# Patient Record
Sex: Female | Born: 1975
Health system: Southern US, Community
[De-identification: ages and names within clinical notes are randomized; demographics above are authoritative.]

## PROBLEM LIST (undated history)

## (undated) DIAGNOSIS — E119 Type 2 diabetes mellitus without complications: Secondary | ICD-10-CM

## (undated) DIAGNOSIS — G629 Polyneuropathy, unspecified: Secondary | ICD-10-CM

## (undated) HISTORY — DX: Type 2 diabetes mellitus without complications: E11.9

## (undated) HISTORY — DX: Polyneuropathy, unspecified: G62.9

## (undated) HISTORY — PX: CHOLECYSTECTOMY: SHX55

## (undated) HISTORY — PX: TUBAL LIGATION: SHX77

---

## 2006-08-15 ENCOUNTER — Ambulatory Visit (HOSPITAL_COMMUNITY): Admission: RE | Admit: 2006-08-15 | Discharge: 2006-08-15 | Payer: Self-pay | Admitting: Gastroenterology

## 2006-09-18 ENCOUNTER — Encounter: Admission: RE | Admit: 2006-09-18 | Discharge: 2006-11-15 | Payer: Self-pay | Admitting: Orthopedic Surgery

## 2009-02-28 ENCOUNTER — Inpatient Hospital Stay (HOSPITAL_COMMUNITY): Admission: EM | Admit: 2009-02-28 | Discharge: 2009-03-03 | Payer: Self-pay | Admitting: Emergency Medicine

## 2009-03-01 ENCOUNTER — Encounter (INDEPENDENT_AMBULATORY_CARE_PROVIDER_SITE_OTHER): Payer: Self-pay | Admitting: General Surgery

## 2009-12-25 ENCOUNTER — Emergency Department (HOSPITAL_COMMUNITY)
Admission: EM | Admit: 2009-12-25 | Discharge: 2009-12-25 | Payer: Self-pay | Source: Home / Self Care | Admitting: Emergency Medicine

## 2010-07-21 LAB — URINE MICROSCOPIC-ADD ON

## 2010-07-21 LAB — CBC
HCT: 40.3 % (ref 36.0–46.0)
Hemoglobin: 13.1 g/dL (ref 12.0–15.0)
MCH: 24.2 pg — ABNORMAL LOW (ref 26.0–34.0)
MCHC: 32.5 g/dL (ref 30.0–36.0)
MCV: 74.4 fL — ABNORMAL LOW (ref 78.0–100.0)
Platelets: 262 10*3/uL (ref 150–400)
RDW: 15.2 % (ref 11.5–15.5)
WBC: 8.6 10*3/uL (ref 4.0–10.5)

## 2010-07-21 LAB — BASIC METABOLIC PANEL
BUN: 9 mg/dL (ref 6–23)
CO2: 22 mEq/L (ref 19–32)
Calcium: 8.6 mg/dL (ref 8.4–10.5)
Chloride: 101 mEq/L (ref 96–112)
GFR calc non Af Amer: >60 mL/min (ref >60)
Sodium: 130 mEq/L — ABNORMAL LOW (ref 135–145)

## 2010-07-21 LAB — URINE CULTURE: Colony Count: >=100000

## 2010-07-21 LAB — DIFFERENTIAL
Lymphocytes Relative: 26 % (ref 12–46)
Neutro Abs: 5.8 10*3/uL (ref 1.7–7.7)

## 2010-07-21 LAB — URINALYSIS, ROUTINE W REFLEX MICROSCOPIC: Protein, ur: 30 mg/dL — AB

## 2010-07-21 LAB — GC/CHLAMYDIA PROBE AMP, GENITAL: Chlamydia, DNA Probe: NEGATIVE

## 2010-07-21 LAB — WET PREP, GENITAL
Trich, Wet Prep: NONE SEEN
Yeast Wet Prep HPF POC: NONE SEEN

## 2010-07-21 LAB — POCT CARDIAC MARKERS: Myoglobin, poc: 59.7 ng/mL (ref 12–200)

## 2010-08-10 LAB — COMPREHENSIVE METABOLIC PANEL WITH GFR
AST: 283 U/L — ABNORMAL HIGH (ref 0–37)
Albumin: 2.8 g/dL — ABNORMAL LOW (ref 3.5–5.2)
Calcium: 8.1 mg/dL — ABNORMAL LOW (ref 8.4–10.5)
GFR calc Af Amer: >60 mL/min (ref >60)
Sodium: 133 meq/L — ABNORMAL LOW (ref 135–145)

## 2010-08-10 LAB — GLUCOSE, CAPILLARY
Glucose-Capillary: 118 mg/dL — ABNORMAL HIGH (ref 70–99)
Glucose-Capillary: 130 mg/dL — ABNORMAL HIGH (ref 70–99)
Glucose-Capillary: 135 mg/dL — ABNORMAL HIGH (ref 70–99)
Glucose-Capillary: 135 mg/dL — ABNORMAL HIGH (ref 70–99)
Glucose-Capillary: 150 mg/dL — ABNORMAL HIGH (ref 70–99)
Glucose-Capillary: 158 mg/dL — ABNORMAL HIGH (ref 70–99)
Glucose-Capillary: 161 mg/dL — ABNORMAL HIGH (ref 70–99)
Glucose-Capillary: 167 mg/dL — ABNORMAL HIGH (ref 70–99)
Glucose-Capillary: 167 mg/dL — ABNORMAL HIGH (ref 70–99)
Glucose-Capillary: 181 mg/dL — ABNORMAL HIGH (ref 70–99)
Glucose-Capillary: 203 mg/dL — ABNORMAL HIGH (ref 70–99)
Glucose-Capillary: 203 mg/dL — ABNORMAL HIGH (ref 70–99)
Glucose-Capillary: 218 mg/dL — ABNORMAL HIGH (ref 70–99)
Glucose-Capillary: 232 mg/dL — ABNORMAL HIGH (ref 70–99)
Glucose-Capillary: 271 mg/dL — ABNORMAL HIGH (ref 70–99)

## 2010-08-10 LAB — COMPREHENSIVE METABOLIC PANEL
ALT: 130 U/L — ABNORMAL HIGH (ref 0–35)
ALT: 198 U/L — ABNORMAL HIGH (ref 0–35)
AST: 249 U/L — ABNORMAL HIGH (ref 0–37)
Albumin: 2.6 g/dL — ABNORMAL LOW (ref 3.5–5.2)
Alkaline Phosphatase: 150 U/L — ABNORMAL HIGH (ref 39–117)
Alkaline Phosphatase: 153 U/L — ABNORMAL HIGH (ref 39–117)
BUN: 6 mg/dL (ref 6–23)
CO2: 25 mEq/L (ref 19–32)
Chloride: 102 mEq/L (ref 96–112)
Creatinine, Ser: 0.85 mg/dL (ref 0.4–1.2)
GFR calc Af Amer: >60 mL/min (ref >60)
GFR calc non Af Amer: >60 mL/min (ref >60)
Glucose, Bld: 159 mg/dL — ABNORMAL HIGH (ref 70–99)
Glucose, Bld: 345 mg/dL — ABNORMAL HIGH (ref 70–99)
Potassium: 3.7 mEq/L (ref 3.5–5.1)
Potassium: 4.2 mEq/L (ref 3.5–5.1)
Sodium: 139 mEq/L (ref 135–145)
Total Bilirubin: 0.4 mg/dL (ref 0.3–1.2)
Total Protein: 6.1 g/dL (ref 6.0–8.3)
Total Protein: 6.3 g/dL (ref 6.0–8.3)

## 2010-08-10 LAB — URINE MICROSCOPIC-ADD ON

## 2010-08-10 LAB — CBC
HCT: 31.9 % — ABNORMAL LOW (ref 36.0–46.0)
Hemoglobin: 10.5 g/dL — ABNORMAL LOW (ref 12.0–15.0)
MCHC: 33.1 g/dL (ref 30.0–36.0)
MCV: 76.8 fL — ABNORMAL LOW (ref 78.0–100.0)
Platelets: 210 10*3/uL (ref 150–400)
Platelets: 225 10*3/uL (ref 150–400)
RBC: 4.16 MIL/uL (ref 3.87–5.11)
RDW: 14.6 % (ref 11.5–15.5)
RDW: 14.9 % (ref 11.5–15.5)
WBC: 6.9 10*3/uL (ref 4.0–10.5)
WBC: 7.3 10*3/uL (ref 4.0–10.5)

## 2010-08-10 LAB — DIFFERENTIAL
Basophils Absolute: 0 K/uL (ref 0.0–0.1)
Basophils Relative: 0 % (ref 0–1)
Eosinophils Absolute: 0.1 10*3/uL (ref 0.0–0.7)
Eosinophils Relative: 1 % (ref 0–5)
Lymphocytes Relative: 11 % — ABNORMAL LOW (ref 12–46)
Lymphs Abs: 0.8 K/uL (ref 0.7–4.0)
Monocytes Absolute: 0.4 10*3/uL (ref 0.1–1.0)
Monocytes Relative: 5 % (ref 3–12)
Neutro Abs: 6 10*3/uL (ref 1.7–7.7)
Neutrophils Relative %: 82 % — ABNORMAL HIGH (ref 43–77)

## 2010-08-10 LAB — URINALYSIS, ROUTINE W REFLEX MICROSCOPIC
Bilirubin Urine: NEGATIVE
Glucose, UA: >1000 mg/dL — AB
Hgb urine dipstick: NEGATIVE
Ketones, ur: NEGATIVE mg/dL
Leukocytes, UA: NEGATIVE
Nitrite: NEGATIVE
Protein, ur: NEGATIVE mg/dL
Specific Gravity, Urine: 1.026 (ref 1.005–1.030)
Urobilinogen, UA: 1 mg/dL (ref 0.0–1.0)
pH: 6.5 (ref 5.0–8.0)

## 2010-08-10 LAB — PREGNANCY, URINE: Preg Test, Ur: NEGATIVE

## 2010-08-10 LAB — HEMOGLOBIN A1C: Hgb A1c MFr Bld: 9.5 % — ABNORMAL HIGH (ref 4.6–6.1)

## 2010-08-10 LAB — LIPASE, BLOOD: Lipase: 14 U/L (ref 11–59)

## 2010-09-22 NOTE — Op Note (Signed)
Leslie Moore, Leslie Moore            ACCOUNT NO.:  [id number]   MEDICAL RECORD NO.:  [id number]          PATIENT TYPE:  AMB   LOCATION:  ENDO                         FACILITY:  [facility]   PHYSICIAN:  Shirley Friar, MDDATE OF BIRTH:  1976-03-18   DATE OF PROCEDURE:  08/15/2006  DATE OF DISCHARGE:                               OPERATIVE REPORT   PROCEDURE:  Upper endoscopy.   INDICATIONS:  Abdominal pain, chronic NSAID use.   MEDICATIONS:  Fentanyl 75 mcg IV, Versed 10 mg IV.   FINDINGS:  Endoscope was inserted to the oropharynx.  Esophagus was  intubated which was normal in its entirety.  Endoscope was advanced  down to the stomach which revealed a small antral erosion but otherwise  was normal.  Retroflexion was done which revealed normal proximal  stomach.  Endoscope was straightened and advanced down to the duodenal  bulb and second portion of duodenum which were both normal.  Endoscope  was withdrawn back into the stomach and one biopsy was taken in the  antrum to send for CLO testing.  The endoscope was then withdrawn to  confirmed above findings.   ASSESSMENT:  1. Small antral erosion;  otherwise normal EGD.  2. Status post biopsy for CLO-test.  3. No ulcer seen.      Shirley Friar, MD  Electronically Signed     VCS/MEDQ  D:  08/15/2006  T:  08/16/2006  Job:  [id number]   cc:   Shelbie Proctor. Shawnie Pons, M.D.

## 2016-11-29 ENCOUNTER — Other Ambulatory Visit: Payer: Self-pay | Admitting: Internal Medicine

## 2016-11-29 DIAGNOSIS — Z1231 Encounter for screening mammogram for malignant neoplasm of breast: Secondary | ICD-10-CM

## 2016-12-10 ENCOUNTER — Ambulatory Visit
Admission: RE | Admit: 2016-12-10 | Discharge: 2016-12-10 | Disposition: A | Discharge status: Home / Self Care | Payer: Self-pay | Source: Ambulatory Visit | Attending: Internal Medicine | Admitting: Internal Medicine

## 2016-12-10 DIAGNOSIS — Z1231 Encounter for screening mammogram for malignant neoplasm of breast: Secondary | ICD-10-CM

## 2016-12-25 ENCOUNTER — Other Ambulatory Visit: Payer: Self-pay | Admitting: Obstetrics and Gynecology

## 2016-12-25 ENCOUNTER — Other Ambulatory Visit (HOSPITAL_COMMUNITY)
Admission: RE | Admit: 2016-12-25 | Discharge: 2016-12-25 | Disposition: A | Discharge status: Home / Self Care | Payer: BLUE CROSS/BLUE SHIELD | Source: Ambulatory Visit | Attending: Obstetrics and Gynecology | Admitting: Obstetrics and Gynecology

## 2016-12-25 DIAGNOSIS — Z124 Encounter for screening for malignant neoplasm of cervix: Secondary | ICD-10-CM | POA: Diagnosis present

## 2016-12-31 LAB — CYTOLOGY - PAP
Diagnosis: NEGATIVE
HPV: NOT DETECTED

## 2017-02-26 ENCOUNTER — Other Ambulatory Visit: Payer: Self-pay | Admitting: Obstetrics and Gynecology

## 2017-03-14 ENCOUNTER — Encounter: Payer: Self-pay | Admitting: Neurology

## 2017-06-14 ENCOUNTER — Other Ambulatory Visit (INDEPENDENT_AMBULATORY_CARE_PROVIDER_SITE_OTHER): Payer: BLUE CROSS/BLUE SHIELD

## 2017-06-14 ENCOUNTER — Encounter: Payer: Self-pay | Admitting: Neurology

## 2017-06-14 ENCOUNTER — Ambulatory Visit: Payer: BLUE CROSS/BLUE SHIELD | Admitting: Neurology

## 2017-06-14 VITALS — BP 100/68 | HR 61 | Ht 64.0 in | Wt 158.5 lb

## 2017-06-14 DIAGNOSIS — E1140 Type 2 diabetes mellitus with diabetic neuropathy, unspecified: Secondary | ICD-10-CM

## 2017-06-14 LAB — C-REACTIVE PROTEIN: CRP: 0.2 mg/dL — ABNORMAL LOW (ref 0.5–20.0)

## 2017-06-14 LAB — FOLATE: Folate: >23.6 ng/mL (ref >5.9)

## 2017-06-14 LAB — SEDIMENTATION RATE: Sed Rate: 105 mm/hr — ABNORMAL HIGH (ref 0–20)

## 2017-06-14 LAB — VITAMIN B12: Vitamin B-12: 555 pg/mL (ref 211–911)

## 2017-06-14 MED ORDER — GABAPENTIN 300 MG PO CAPS: ORAL_CAPSULE | ORAL | 5 refills | Status: DC

## 2017-06-14 NOTE — Patient Instructions (Addendum)
Start gabapentin 300mg  at bedtime x 3 days, then increase to 1 tablet twice daily.   Start lidocaine ointment to feet twice daily as needed (Aspercream, Salonpas).  Avoid creams with menthol  Check labs   Call my office with an update in 2 weeks and we can decide how to increase the medication  Return to clinic in on April 4th at 3:30pm

## 2017-06-14 NOTE — Addendum Note (Signed)
Addended by: Vivien RotaRIVER, Daly Whipkey H on: 1/6/10962/12/2017 02:31 PM   Modules accepted: Orders

## 2017-06-14 NOTE — Progress Notes (Signed)
[facility] Clinic Note - Initial Visit   Date: 06/14/17  KINAYA HILLIKER MRN: [id number] DOB: [birth year]   Dear Dr. Fara Olden:  Thank you for your kind referral of TALYSSA GIBAS for consultation of diabetic neuropathy. Although her history is well known to you, please allow [place] to reiterate it for the purpose of our medical record. The patient was accompanied to the clinic by granddaughter, Acie Fredrickson.   History of Present Illness: Leslie Moore is a 42 y.o. right-handed African American female with diabetes mellitus complicated by retinopathy presenting for evaluation of neuropathy.    She was diagnosed with diabetes 2010 and starting in 2018, she started having burning, shooting, and tingling sensation of the hands and feet. Pain does not radiate in her lower legs or forearms.  She has sensitivity to touch and tight shoes.  Walking, especially barefoot makes her pain significantly worse.  Symptoms were intermittent and now occur nightly.  She has difficulty with opening jars/bottles and has had a few falls because of severe pain.  She was offered gabapentin 153m and did not appreciate benefit so stopped it.  She is not working currently.    Out-side paper records, electronic medical record, and images have been reviewed where available and summarized as:  Lab 03/12/2017:  HbA1c 7.3, Cr 0.66, GFR 118, LFTs normal  Past Medical History:  Diagnosis Date  . [place]   . Neuropathy     Past Surgical History:  Procedure Laterality Date  . CHOLECYSTECTOMY    . TUBAL LIGATION       Medications:  Outpatient Encounter Medications as of 06/14/2017  Medication Sig  . EQ ASPIRIN ADULT LOW DOSE 81 MG EC tablet   . GLIPIZIDE XL 5 MG 24 hr tablet   . metFORMIN (GLUCOPHAGE) 500 MG tablet Take 500 mg by mouth 2 (two) times daily with a meal.  . Semaglutide (OZEMPIC) 0.25 or 0.5 MG/DOSE SOPN Inject into the skin.  .Marland Kitchengabapentin (NEURONTIN) 300 MG  capsule Take 1 tablet daily x 3 days then increase to 1 tablet twice daily.  Call with update in 2 weeks.   No facility-administered encounter medications on file as of 06/14/2017.      Allergies: No Known Allergies  Family History: Family History  Problem Relation Age of Onset  . Paranoid behavior Mother     Social History: Social History   Tobacco Use  . Smoking status: Never Smoker  . Smokeless tobacco: Never Used  Substance Use Topics  . Alcohol use: No    Frequency: Never  . Drug use: No   Social History   Social History Narrative   Lives with husband in a one story home.  Has 4 children.  Currently not working.  Was working as a CTechnical brewer  Education: high school.     Review of Systems:  CONSTITUTIONAL: No fevers, chills, night sweats, or weight loss.   EYES: No visual changes or eye pain ENT: No hearing changes.  No history of nose bleeds.   RESPIRATORY: No cough, wheezing and shortness of breath.   CARDIOVASCULAR: Negative for chest pain, and palpitations.   GI: Negative for abdominal discomfort, blood in stools or black stools.  No recent change in bowel habits.   GU:  No history of incontinence.   MUSCLOSKELETAL: No history of joint pain or swelling.  +myalgias.   SKIN: Negative for lesions, rash, and itching.   HEMATOLOGY/ONCOLOGY: Negative for prolonged bleeding, bruising easily, and swollen nodes.  No  history of cancer.   ENDOCRINE: Negative for cold or heat intolerance, polydipsia or goiter.   PSYCH:  No depression or anxiety symptoms.   NEURO: As Above.   Vital Signs:  BP 100/68   Pulse 61   Ht '5\' 4"'  (1.626 m)   Wt 158 lb 8 oz (71.9 kg)   SpO2 100%   BMI 27.21 kg/m    General Medical Exam:   General:  Well appearing, comfortable.   Eyes/ENT: see cranial nerve examination.   Neck: No masses appreciated.  Full range of motion without tenderness.  No carotid bruits. Respiratory:  Clear to auscultation, good air entry bilaterally.   Cardiac:  Regular  rate and rhythm, no murmur.   Extremities:  No deformities, edema, or skin discoloration.  Skin:  No rashes or lesions.  Neurological Exam: MENTAL STATUS including orientation to time, place, person, recent and remote memory, attention span and concentration, language, and fund of knowledge is normal.  Speech is not dysarthric.  CRANIAL NERVES: II:  No visual field defects.  Increased vascularity in the posterior segment, disc are flat..   III-IV-VI: Pupils equal round and reactive to light.  Normal conjugate, extra-ocular eye movements in all directions of gaze.  No nystagmus.  No ptosis.   V:  Normal facial sensation.   VII:  Normal facial symmetry and movements.   VIII:  Normal hearing and vestibular function.   IX-X:  Normal palatal movement.   XI:  Normal shoulder shrug and head rotation.   XII:  Normal tongue strength and range of motion, no deviation or fasciculation.  MOTOR:  No atrophy, fasciculations or abnormal movements.  No pronator drift.  Tone is normal.    Right Upper Extremity:    Left Upper Extremity:    Deltoid  5/5   Deltoid  5/5   Biceps  5/5   Biceps  5/5   Triceps  5/5   Triceps  5/5   Wrist extensors  5/5   Wrist extensors  5/5   Wrist flexors  5/5   Wrist flexors  5/5   Finger extensors  5/5   Finger extensors  5/5   Finger flexors  5/5   Finger flexors  5/5   Dorsal interossei  4+/5   Dorsal interossei  4+/5   Abductor pollicis  4+/5   Abductor pollicis  4+/5   Tone (Ashworth scale)  0  Tone (Ashworth scale)  0   Right Lower Extremity:    Left Lower Extremity:    Hip flexors  5/5   Hip flexors  5/5   Hip extensors  5/5   Hip extensors  5/5   Knee flexors  5/5   Knee flexors  5/5   Knee extensors  5/5   Knee extensors  5/5   Dorsiflexors  5/5   Dorsiflexors  5/5   Plantarflexors  5/5   Plantarflexors  5/5   Toe extensors  4+/5   Toe extensors  4+/5   Toe flexors  4+/5   Toe flexors  4+/5   Tone (Ashworth scale)  0  Tone (Ashworth scale)  0    MSRs:  Right  Left brachioradialis 2+  brachioradialis 2+  biceps 2+  biceps 2+  triceps 2+  triceps 2+  patellar 2+  patellar 2+  ankle jerk 0  ankle jerk 0  Hoffman no  Hoffman no  plantar response down  plantar response down   SENSORY:  Vibration is 100% at the elbows, reduced to 50% at the hands and knees, absent at the ankles.  Temperature and pin prick is reduced over the fingertips and distal to ankles bilaterally.  Romberg's sign shows mild sway.   COORDINATION/GAIT: Normal finger-to- nose-finger.  Intact rapid alternating movements bilaterally.  Gait appears antalgic due to feet pain and she becomes tearful when walking barefoot.  She is able to walk more comfortably wearing socks.  Unable to stand on heels or toes.    IMPRESSION: Distal and symmetric painful diabetic neuropathy affecting a stocking-glove distribution.    - Patient educated on the diagnosis, pathogenesis, and management options  - Start gabapentin 378m at bedtime x 3 days, then increase to 3013mBID.  Patient to call with update in 2 weeks and if tolerating, titrate higher  - Start OTC lidocaine ointment to feet  - Check vitamin B12, folate, copper, ESR, and CRP  - Encouraged her to work closely with her PCP to keep blood sugars under control  - Fall precautions discussed  Return to clinic in 2 months.  Thank you for allowing me to participate in patient's care.  If I can answer any additional questions, I would be pleased to do so.    Sincerely,    Lupe Handley K. PaPosey ProntoDO

## 2017-06-17 LAB — ANTI-NUCLEAR AB-TITER (ANA TITER)

## 2017-06-17 LAB — SJOGREN'S SYNDROME ANTIBODS(SSA + SSB)
SSA (Ro) (ENA) Antibody, IgG: <1 AI
SSB (LA) (ENA) ANTIBODY, IGG: NEGATIVE AI

## 2017-06-17 LAB — ANA: ANA: POSITIVE — AB

## 2017-06-18 LAB — COPPER, SERUM: COPPER: 144 ug/dL (ref 70–175)

## 2017-06-19 ENCOUNTER — Telehealth: Payer: Self-pay | Admitting: *Deleted

## 2017-06-19 ENCOUNTER — Other Ambulatory Visit: Payer: Self-pay | Admitting: *Deleted

## 2017-06-19 DIAGNOSIS — R899 Unspecified abnormal finding in specimens from other organs, systems and tissues: Secondary | ICD-10-CM

## 2017-06-19 NOTE — Telephone Encounter (Signed)
Left message for patient to call me back. 

## 2017-06-19 NOTE — Telephone Encounter (Signed)
-----  Message from Leslie Berthold, DO sent at 06/19/2017  9:35 AM EST ----- Please inform patient that her inflammatory marker is very high and I want to check a few more autoimmune labs - check RF, cryoglobulins, c-ANCA and p-ANCA, ACE, ESR.  The remaining labs looked good.   Also ask to see how she is tolerating gabapentin.  Thanks.

## 2017-06-19 NOTE — Telephone Encounter (Signed)
Patient given results and instructions.  She will come in on Friday to have labs done.  Doing well on gabapentin.

## 2017-06-20 ENCOUNTER — Other Ambulatory Visit: Payer: Self-pay | Admitting: *Deleted

## 2017-06-20 DIAGNOSIS — R899 Unspecified abnormal finding in specimens from other organs, systems and tissues: Secondary | ICD-10-CM

## 2017-06-21 ENCOUNTER — Other Ambulatory Visit (INDEPENDENT_AMBULATORY_CARE_PROVIDER_SITE_OTHER): Payer: BLUE CROSS/BLUE SHIELD

## 2017-06-21 DIAGNOSIS — R899 Unspecified abnormal finding in specimens from other organs, systems and tissues: Secondary | ICD-10-CM

## 2017-06-21 LAB — SEDIMENTATION RATE: SED RATE: 91 mm/h — AB (ref 0–20)

## 2017-06-24 LAB — RHEUMATOID FACTOR: Rhuematoid fact SerPl-aCnc: <14 IU/mL (ref <14)

## 2017-06-24 LAB — ANGIOTENSIN CONVERTING ENZYME: ANGIOTENSIN-CONVERTING ENZYME: 55 U/L (ref 9–67)

## 2017-06-24 LAB — ANCA SCREEN W REFLEX TITER: ANCA SCREEN: NEGATIVE

## 2017-06-24 LAB — CRYOGLOBULIN

## 2017-06-24 NOTE — Progress Notes (Unsigned)
383 

## 2017-06-28 ENCOUNTER — Telehealth: Payer: Self-pay | Admitting: Neurology

## 2017-06-28 NOTE — Telephone Encounter (Signed)
Patient came by the office after leaving the lab downstairs. She said [facility] was unable to do the test that Dr. Allena KatzPatel had ordered (She tried twice). She said she will be going to [facility]. Thanks

## 2017-07-01 NOTE — Telephone Encounter (Signed)
Noted  

## 2017-07-02 ENCOUNTER — Telehealth: Payer: Self-pay | Admitting: Neurology

## 2017-07-02 NOTE — Telephone Encounter (Signed)
Pt called and wanted to know if her blood work results were back yet

## 2017-07-03 NOTE — Telephone Encounter (Signed)
Patient informed that I am waiting for one more lab result to come in and then I will call her.

## 2017-07-04 ENCOUNTER — Telehealth: Payer: Self-pay | Admitting: *Deleted

## 2017-07-04 LAB — CRYOGLOBULIN: Cryoglobulin, Qualitative Analysis: NOT DETECTED

## 2017-07-04 NOTE — Telephone Encounter (Signed)
-----   Message from [place] Chardonika K Patel, DO sent at 07/04/2017 10:47 AM EST ----- Please inform patient that her autoimmune labs returned normal.  I would like to go ahead and have her set up for NCS/EMG of the legs, as long as her pain is better controlled, since the test itself can be painful.  If she is agreeable, please order and have the front schedule it. Thanks.

## 2017-07-04 NOTE — Telephone Encounter (Signed)
Patient given results.  NCS/EMG not scheduled yet.  Patient said that she is still in a lot of pain.  Please advise.

## 2017-07-04 NOTE — Telephone Encounter (Signed)
Patient given all results

## 2017-07-04 NOTE — Telephone Encounter (Signed)
She is on gabapentin 300mg  twice daily, let's increase to 300mg  in the morning and 600mg  at bedtime x1 week, then increase to 600mg  BID.  She will need a new Rx.  Thanks.

## 2017-07-05 ENCOUNTER — Other Ambulatory Visit: Payer: Self-pay | Admitting: *Deleted

## 2017-07-05 MED ORDER — GABAPENTIN 300 MG PO CAPS: ORAL_CAPSULE | ORAL | 5 refills | Status: DC

## 2017-07-05 NOTE — Telephone Encounter (Signed)
Patient given instructions.  Will call in Rx on Friday per patient's request.

## 2017-07-16 ENCOUNTER — Encounter: Payer: BLUE CROSS/BLUE SHIELD | Admitting: Neurology

## 2017-08-08 ENCOUNTER — Ambulatory Visit: Payer: BLUE CROSS/BLUE SHIELD | Admitting: Neurology

## 2017-10-22 ENCOUNTER — Emergency Department (HOSPITAL_COMMUNITY): Payer: Medicaid Other

## 2017-10-22 ENCOUNTER — Emergency Department (HOSPITAL_COMMUNITY)
Admission: EM | Admit: 2017-10-22 | Discharge: 2017-10-22 | Disposition: A | Discharge status: Home / Self Care | Payer: Medicaid Other | Attending: Emergency Medicine | Admitting: Emergency Medicine

## 2017-10-22 ENCOUNTER — Encounter (HOSPITAL_COMMUNITY): Payer: Self-pay

## 2017-10-22 ENCOUNTER — Other Ambulatory Visit: Payer: Self-pay

## 2017-10-22 DIAGNOSIS — Z7984 Long term (current) use of oral hypoglycemic drugs: Secondary | ICD-10-CM | POA: Insufficient documentation

## 2017-10-22 DIAGNOSIS — Z7982 Long term (current) use of aspirin: Secondary | ICD-10-CM | POA: Diagnosis not present

## 2017-10-22 DIAGNOSIS — Z79899 Other long term (current) drug therapy: Secondary | ICD-10-CM | POA: Diagnosis not present

## 2017-10-22 DIAGNOSIS — E119 Type 2 diabetes mellitus without complications: Secondary | ICD-10-CM | POA: Diagnosis not present

## 2017-10-22 DIAGNOSIS — J181 Lobar pneumonia, unspecified organism: Secondary | ICD-10-CM | POA: Diagnosis not present

## 2017-10-22 DIAGNOSIS — J189 Pneumonia, unspecified organism: Secondary | ICD-10-CM

## 2017-10-22 DIAGNOSIS — R05 Cough: Secondary | ICD-10-CM | POA: Diagnosis present

## 2017-10-22 MED ORDER — GUAIFENESIN 100 MG/5ML PO SOLN
10.0000 mL | Freq: Once | ORAL | Status: AC
  Administered 2017-10-22: 200 mg via ORAL
  Filled 2017-10-22: qty 10

## 2017-10-22 MED ORDER — AZITHROMYCIN 250 MG PO TABS: 250.0000 mg | ORAL_TABLET | Freq: Every day | ORAL | 0 refills | Status: AC

## 2017-10-22 MED ORDER — AMOXICILLIN-POT CLAVULANATE 875-125 MG PO TABS: 1.0000 | ORAL_TABLET | Freq: Two times a day (BID) | ORAL | 0 refills | Status: AC

## 2017-10-22 MED ORDER — GUAIFENESIN 100 MG/5ML PO SYRP: 200.0000 mg | ORAL_SOLUTION | ORAL | 0 refills | Status: AC | PRN

## 2017-10-22 NOTE — ED Triage Notes (Signed)
Patient c/o a productive cough with clear/white sputum x 1 1/2 weeks.

## 2017-10-22 NOTE — ED Notes (Signed)
Bed: [facility] Expected date:  Expected time:  Means of arrival:  Comments: 

## 2017-10-22 NOTE — ED Provider Notes (Signed)
[facility]-EMERGENCY DEPT Provider Note   CSN: [id number] Arrival date & time: 10/22/17  1450     History   Chief Complaint Chief Complaint  Patient presents with  . Cough    HPI Leslie Moore is a 42 y.o. female.  Leslie Moore is a 42 y.o. Female with history of diabetes and neuropathy, presents to the emergency department for evaluation of 1-1/2 weeks of productive cough.  Patient reports she has been coughing up clear and white to yellow sputum.  She reports cough is beginning progressively worse, it keeps her from sleeping, and her chest feels sore from all the coughing.  She denies associated rhinorrhea or nasal congestion, denies sore throat, or ear pain.  She reports subjective fevers and chills.  She has been treating her symptoms supportively with over-the-counter medications without improvement.  She denies associated chest pain or shortness of breath, no abdominal pain, nausea or vomiting, she does report poor appetite.      Past Medical History:  Diagnosis Date  . Diabetes (H[facility])   . Neuropathy     There are no active problems to display for this patient.   Past Surgical History:  Procedure Laterality Date  . CHOLECYSTECTOMY    . TUBAL LIGATION       OB History   None      Home Medications    Prior to Admission medications   Medication Sig Start Date End Date Taking? Authorizing Provider  EQ ASPIRIN ADULT LOW DOSE 81 MG EC tablet  04/13/17   [provider]  gabapentin (NEURONTIN) 300 MG capsule 600 mg twice a day. 07/05/17   Nita SicklePatel, Donika K, DO  GLIPIZIDE XL 5 MG 24 hr tablet  04/13/17   [provider]  metFORMIN (GLUCOPHAGE) 500 MG tablet Take 500 mg by mouth 2 (two) times daily with a meal.    [provider]  Semaglutide (OZEMPIC) 0.25 or 0.5 MG/DOSE SOPN Inject into the skin.    [provider]    Family History Family History  Problem Relation Age of Onset  . Paranoid behavior  Mother     Social History Social History   Tobacco Use  . Smoking status: Never Smoker  . Smokeless tobacco: Never Used  Substance Use Topics  . Alcohol use: No    Frequency: Never  . Drug use: No     Allergies   Patient has no known allergies.   Review of Systems Review of Systems  Constitutional: Positive for chills. Negative for fever.  HENT: Negative for congestion, rhinorrhea and sore throat.   Eyes: Negative for visual disturbance.  Respiratory: Positive for cough. Negative for shortness of breath.   Cardiovascular: Negative for chest pain.  Gastrointestinal: Negative for abdominal pain, nausea and vomiting.  Musculoskeletal: Positive for myalgias. Negative for arthralgias.  Skin: Negative for color change and rash.  Neurological: Negative for dizziness, syncope, light-headedness and headaches.     Physical Exam Updated Vital Signs BP 97/70 (BP Location: Right Arm)   Pulse 98   Temp (!) 97.4 F (36.3 C) (Oral)   Resp 16   Ht 5\' 4"  (1.626 m)   Wt 63.5 kg (140 lb)   LMP 09/15/2017   SpO2 100%   BMI 24.03 kg/m   Physical Exam  Constitutional: She appears well-developed and well-nourished. No distress.  HENT:  Head: Normocephalic and atraumatic.  Mouth/Throat: Oropharynx is clear and moist.  TMs clear with good landmarks, mild nasal mucosa edema with  clear rhinorrhea, posterior oropharynx clear and moist, with some erythema, no edema or exudates  Eyes: Right eye exhibits no discharge. Left eye exhibits no discharge.  Neck: Normal range of motion. Neck supple.  No rigidity  Cardiovascular: Normal rate, regular rhythm, normal heart sounds and intact distal pulses.  Pulmonary/Chest: Effort normal. No stridor. No respiratory distress. She has no wheezes. She has rales.  Respirations equal and unlabored, patient able to speak in full sentences, mild crackles heard in the right middle lobe, all other lung fields clear to auscultation with good air movement    Abdominal: Soft. Bowel sounds are normal. She exhibits no distension and no mass. There is no tenderness. There is no guarding.  Abdomen soft, nondistended, nontender to palpation in all quadrants without guarding or peritoneal signs  Musculoskeletal: She exhibits no edema or deformity.  Lymphadenopathy:    She has no cervical adenopathy.  Neurological: She is alert. Coordination normal.  Skin: Skin is warm and dry. Capillary refill takes less than 2 seconds. She is not diaphoretic.  Psychiatric: She has a normal mood and affect. Her behavior is normal.  Nursing note and vitals reviewed.    ED Treatments / Results  Labs (all labs ordered are listed, but only abnormal results are displayed) Labs Reviewed - No data to display  EKG None  Radiology Dg Chest 2 View  Result Date: 10/22/2017 CLINICAL DATA:  Cough and chest discomfort x 1 + weeks - never a smoker - diabetic - pt states no other chest hx EXAM: CHEST - 2 VIEW COMPARISON:  None. FINDINGS: Cardiomediastinal silhouette is normal. There is focal infiltrate in the MEDIAL RIGHT lung base, consistent with infectious process. LEFT lung is clear. There is no pulmonary edema. Surgical clips are seen in the RIGHT UPPER QUADRANT the abdomen. IMPRESSION: Findings consistent with infectious infiltrate MEDIAL RIGHT LOWER lobe. Followup PA and lateral chest X-ray is recommended in 3-4 weeks following trial of antibiotic therapy to ensure resolution and exclude underlying malignancy. Electronically Signed   By: Norva Pavlov M.D.   On: 10/22/2017 16:05    Procedures Procedures (including critical care time)  Medications Ordered in ED Medications  guaiFENesin (ROBITUSSIN) 100 MG/5ML solution 200 mg (200 mg Oral Given 10/22/17 1743)     Initial Impression / Assessment and Plan / ED Course  I have reviewed the triage vital signs and the nursing notes.  Pertinent labs & imaging results that were available during my care of the patient  were reviewed by me and considered in my medical decision making (see chart for details).  Patient has been diagnosed with CAP via chest xray. Pt is not ill appearing, immunocompromised, and does not have multiple co morbidities, therefore I feel like the they can be treated as an OP with abx therapy. Pt has been advised to return to the ED if symptoms worsen or they do not improve. Pt verbalizes understanding and is agreeable with plan.    Final Clinical Impressions(s) / ED Diagnoses   Final diagnoses:  Community acquired pneumonia of right middle lobe of lung [facility])    ED Discharge Orders        Ordered    guaifenesin (ROBITUSSIN) 100 MG/5ML syrup  Every 4 hours PRN     10/22/17 1736    amoxicillin-clavulanate (AUGMENTIN) 875-125 MG tablet  2 times daily     10/22/17 1736    azithromycin (ZITHROMAX) 250 MG tablet  Daily     10/22/17 1736  Dartha Lodge, PA-C 10/22/17 1829    Tegeler, Canary Brim, MD 10/23/17 0120

## 2017-10-22 NOTE — Discharge Instructions (Signed)
Your chest x-ray shows pneumonia.  Please take both antibiotics as directed.  Augmentin can cause some stomach upset and diarrhea, please make sure you take this medication with food on your stomach, you can also take a probiotic daily to help prevent diarrhea.  You may use Robitussin as needed for cough.  Ibuprofen as Tylenol as needed for pain.  Return to the emergency department if you develop fevers, worsening cough, chest pain, shortness of breath or any other new or concerning symptoms.

## 2018-01-13 ENCOUNTER — Other Ambulatory Visit: Payer: Self-pay | Admitting: Internal Medicine

## 2018-01-13 DIAGNOSIS — Z1231 Encounter for screening mammogram for malignant neoplasm of breast: Secondary | ICD-10-CM

## 2018-01-15 ENCOUNTER — Ambulatory Visit (INDEPENDENT_AMBULATORY_CARE_PROVIDER_SITE_OTHER): Payer: Medicaid Other | Admitting: Neurology

## 2018-01-15 ENCOUNTER — Encounter: Payer: Self-pay | Admitting: Neurology

## 2018-01-15 VITALS — BP 110/80 | HR 117 | Ht 64.0 in | Wt 163.1 lb

## 2018-01-15 DIAGNOSIS — E1140 Type 2 diabetes mellitus with diabetic neuropathy, unspecified: Secondary | ICD-10-CM

## 2018-01-15 MED ORDER — GABAPENTIN 300 MG PO CAPS: 600.0000 mg | ORAL_CAPSULE | Freq: Two times a day (BID) | ORAL | 5 refills | Status: AC

## 2018-01-15 NOTE — Progress Notes (Signed)
Follow-up Visit   Date: 01/15/2018    Leslie Moore MRN: [id number] DOB: Sep 10, 1975   Interim History: Leslie Moore is a 42 y.o. right-handed African American female with diabetes mellitus complicated by retinopathy returning to the clinic for follow-up of diabetic neuropathy.  The patient was accompanied to the clinic by self.  History of present illness: She was diagnosed with diabetes 2010 and starting in 2018, she started having burning, shooting, and tingling sensation of the hands and feet. Pain does not radiate in her lower legs or forearms.  She has sensitivity to touch and tight shoes.  Walking, especially barefoot makes her pain significantly worse.  Symptoms were intermittent and now occur nightly.  She has difficulty with opening jars/bottles and has had a few falls because of severe pain.  She was offered gabapentin 126m and did not appreciate benefit so stopped it.  She is not working currently.    UPDATE 01/15/2018:  She is here for 619-monthollow-up visit.   She recently got medicaid and before this was unable to afford her medications, so she was not taking any of her prescription medications for several months.  Today, she continues to have severe burning and sharp pain over the feet.  Tactile stimulus makes the pain much worse.  She also has numbness over the soles of the feet.  She has severe pain when showering or lotioning her feet.  She denies imbalance.  She does have weakness of the hands and has noticed a new left hand tremor.      Medications:  Current Outpatient Medications on File Prior to Visit  Medication Sig Dispense Refill  . amoxicillin-clavulanate (AUGMENTIN) 875-125 MG tablet Take 1 tablet by mouth 2 (two) times daily. One po bid x 7 days 14 tablet 0  . azithromycin (ZITHROMAX) 250 MG tablet Take 1 tablet (250 mg total) by mouth daily. Take first 2 tablets together, then 1 every day until finished. 6 tablet 0  . EQ ASPIRIN ADULT LOW DOSE 81  MG EC tablet   3  . GLIPIZIDE XL 5 MG 24 hr tablet   1  . guaifenesin (ROBITUSSIN) 100 MG/5ML syrup Take 10 mLs (200 mg total) by mouth every 4 (four) hours as needed for cough. 60 mL 0  . metFORMIN (GLUCOPHAGE) 500 MG tablet Take 500 mg by mouth 2 (two) times daily with a meal.    . Semaglutide (OZEMPIC) 0.25 or 0.5 MG/DOSE SOPN Inject into the skin.     No current facility-administered medications on file prior to visit.     Allergies: No Known Allergies  Review of Systems:  CONSTITUTIONAL: No fevers, chills, night sweats, or weight loss.  EYES: No visual changes or eye pain ENT: No hearing changes.  No history of nose bleeds.   RESPIRATORY: No cough, wheezing and shortness of breath.   CARDIOVASCULAR: Negative for chest pain, and palpitations.   GI: Negative for abdominal discomfort, blood in stools or black stools.  No recent change in bowel habits.   GU:  No history of incontinence.   MUSCLOSKELETAL: No history of joint pain or swelling.  No myalgias.   SKIN: Negative for lesions, rash, and itching.   ENDOCRINE: Negative for cold or heat intolerance, polydipsia or goiter.   PSYCH:  No depression or anxiety symptoms.   NEURO: As Above.   Vital Signs:  BP 110/80   Pulse (!) 117   Ht '5\' 4"'  (1.626 m)   Wt 163 lb 2 oz (  74 kg)   SpO2 99%   BMI 28.00 kg/m   General Medical Exam:   General:  Well appearing, comfortable  Eyes/ENT: see cranial nerve examination.   Neck: No masses appreciated.  Full range of motion without tenderness.  No carotid bruits. Respiratory:  Clear to auscultation, good air entry bilaterally.   Cardiac:  Regular rate and rhythm, no murmur.   Ext:  No edema, exquisite tenderness to palpation over the feet, severe allodynia   Neurological Exam: MENTAL STATUS including orientation to time, place, person, recent and remote memory, attention span and concentration, language, and fund of knowledge is normal.  Speech is not dysarthric.  CRANIAL NERVES: No  visual field defects.  Pupils equal round and reactive to light.  Normal conjugate, extra-ocular eye movements in all directions of gaze.  No ptosis. Normal facial sensation.  Face is symmetric. Palate elevates symmetrically.  Tongue is midline.  MOTOR:  Motor strength is 5/5 in all extremities, except there is mild weakness with finger abductors and toe flexors 4/5 bilaterally.  No atrophy, fasciculations or abnormal movements.  No pronator drift.  Tone is normal.    MSRs:  Reflexes are 2+/4 throughout, except absent at the ankles.  SENSORY:  Vibration is intact at the knees and causes severe hyperesthesia distal to ankles.  Temperature and pin prick intact in the lower legs and again severe hyperesthesia in the feet and fingertips.  COORDINATION/GAIT:  Normal finger-to- nose-finger.  Intact rapid alternating movements bilaterally.  Gait wide-based and slow.  Data: Labs 06/14/2017:  CRP 0.2, ESR 105*, ESR 91*, copper 144, folate > 23, vitamin B12 555, SSA/B neg, RF neg, cryoglobulin neg, ANCA neg, ACE neg, ANA 1:40 nucleolar  Lab Results  Component Value Date   HGBA1C (H) 03/01/2009    9.5 (NOTE) The ADA recommends the following therapeutic goal for glycemic control related to Hgb A1c measurement: Goal of therapy: <6.5 Hgb A1c  Reference: American Diabetes Association: Clinical Practice Recommendations 2010, Diabetes Care, 2010, 33: (Suppl  1).     IMPRESSION/PLAN Distal and symmetric painful diabetic neuropathy affecting stoking-glove distribution, poorly controlled diabetes  - Start gabapentin 332m at bedtime and slowly titrate to 6053mBID (schedule given)  - NCS/EMG of the left arm and leg  - If there are signs of polyradiculoneuropathy, may need to consider LP as her ESR is very high, other autoimmune labs are normal (see above)  - Tight control of diabetes encouraged and she is working with her PCP.  Fortunately, now that she has medicaid, she will be compliant with  medications   Thank you for allowing me to participate in patient's care.  If I can answer any additional questions, I would be pleased to do so.    Sincerely,    Donika K. PaPosey ProntoDO

## 2018-01-15 NOTE — Patient Instructions (Addendum)
1.  Start taking gabapentin as follows:    Morning         Evening  Week 1                                  1 tab              Week 2 1 tab                   1 tab              Week 3 1 tab                   2 tab     Week 4 2 tab           2 tab      Call with update in 2 months, to determine further increase in medication.  If you develop increased sleepiness, stay at the lower dose.           2.  NCS/EMG of the left arm and leg  ELECTROMYOGRAM AND NERVE CONDUCTION STUDIES (EMG/NCS) INSTRUCTIONS  How to Prepare The neurologist conducting the EMG will need to know if you have certain medical conditions. Tell the neurologist and other EMG lab personnel if you: . Have a pacemaker or any other electrical medical device . Take blood-thinning medications . Have hemophilia, a blood-clotting disorder that causes prolonged bleeding Bathing Take a shower or bath shortly before your exam in order to remove oils from your skin. Don't apply lotions or creams before the exam.  What to Expect You'll likely be asked to change into a hospital gown for the procedure and lie down on an examination table. The following explanations can help you understand what will happen during the exam.  . Electrodes. The neurologist or a technician places surface electrodes at various locations on your skin depending on where you're experiencing symptoms. Or the neurologist may insert needle electrodes at different sites depending on your symptoms.  . Sensations. The electrodes will at times transmit a tiny electrical current that you may feel as a twinge or spasm. The needle electrode may cause discomfort or pain that usually ends shortly after the needle is removed. If you are concerned about discomfort or pain, you may want to talk to the neurologist about taking a short break during the exam.  . Instructions. During the needle EMG, the neurologist will assess whether there is any spontaneous electrical activity  when the muscle is at rest - activity that isn't present in healthy muscle tissue - and the degree of activity when you slightly contract the muscle.  He or she will give you instructions on resting and contracting a muscle at appropriate times. Depending on what muscles and nerves the neurologist is examining, he or she may ask you to change positions during the exam.  After your EMG You may experience some temporary, minor bruising where the needle electrode was inserted into your muscle. This bruising should fade within several days. If it persists, contact your primary care doctor.

## 2018-01-16 ENCOUNTER — Encounter: Payer: Medicaid Other | Admitting: Neurology

## 2018-01-16 DIAGNOSIS — E1140 Type 2 diabetes mellitus with diabetic neuropathy, unspecified: Secondary | ICD-10-CM | POA: Insufficient documentation

## 2018-02-10 ENCOUNTER — Ambulatory Visit: Payer: Medicaid Other

## 2018-02-26 ENCOUNTER — Ambulatory Visit: Payer: Medicaid Other

## 2018-03-13 DIAGNOSIS — F329 Major depressive disorder, single episode, unspecified: Secondary | ICD-10-CM | POA: Diagnosis not present

## 2018-03-13 DIAGNOSIS — E1165 Type 2 diabetes mellitus with hyperglycemia: Secondary | ICD-10-CM | POA: Diagnosis not present

## 2018-03-13 DIAGNOSIS — D509 Iron deficiency anemia, unspecified: Secondary | ICD-10-CM | POA: Diagnosis not present

## 2018-03-13 DIAGNOSIS — E785 Hyperlipidemia, unspecified: Secondary | ICD-10-CM | POA: Diagnosis not present

## 2018-03-13 DIAGNOSIS — I10 Essential (primary) hypertension: Secondary | ICD-10-CM | POA: Diagnosis not present

## 2018-03-13 DIAGNOSIS — E11319 Type 2 diabetes mellitus with unspecified diabetic retinopathy without macular edema: Secondary | ICD-10-CM | POA: Diagnosis not present

## 2018-03-13 DIAGNOSIS — E1142 Type 2 diabetes mellitus with diabetic polyneuropathy: Secondary | ICD-10-CM | POA: Diagnosis not present

## 2018-04-10 DIAGNOSIS — R82998 Other abnormal findings in urine: Secondary | ICD-10-CM | POA: Diagnosis not present

## 2018-04-10 DIAGNOSIS — J189 Pneumonia, unspecified organism: Secondary | ICD-10-CM | POA: Diagnosis not present

## 2018-04-10 DIAGNOSIS — F329 Major depressive disorder, single episode, unspecified: Secondary | ICD-10-CM | POA: Diagnosis not present

## 2018-04-10 DIAGNOSIS — Z23 Encounter for immunization: Secondary | ICD-10-CM | POA: Diagnosis not present

## 2018-04-10 DIAGNOSIS — E1165 Type 2 diabetes mellitus with hyperglycemia: Secondary | ICD-10-CM | POA: Diagnosis not present

## 2018-04-10 DIAGNOSIS — Z1239 Encounter for other screening for malignant neoplasm of breast: Secondary | ICD-10-CM | POA: Diagnosis not present

## 2018-04-10 DIAGNOSIS — E1142 Type 2 diabetes mellitus with diabetic polyneuropathy: Secondary | ICD-10-CM | POA: Diagnosis not present

## 2018-04-10 DIAGNOSIS — Z800 Family history of malignant neoplasm of digestive organs: Secondary | ICD-10-CM | POA: Diagnosis not present

## 2018-04-10 DIAGNOSIS — Z0000 Encounter for general adult medical examination without abnormal findings: Secondary | ICD-10-CM | POA: Diagnosis not present

## 2018-04-10 DIAGNOSIS — Z1211 Encounter for screening for malignant neoplasm of colon: Secondary | ICD-10-CM | POA: Diagnosis not present

## 2018-04-17 DIAGNOSIS — E1140 Type 2 diabetes mellitus with diabetic neuropathy, unspecified: Secondary | ICD-10-CM | POA: Diagnosis not present

## 2018-04-17 DIAGNOSIS — E785 Hyperlipidemia, unspecified: Secondary | ICD-10-CM | POA: Diagnosis not present

## 2018-04-17 DIAGNOSIS — E1165 Type 2 diabetes mellitus with hyperglycemia: Secondary | ICD-10-CM | POA: Diagnosis not present

## 2018-04-17 DIAGNOSIS — Z794 Long term (current) use of insulin: Secondary | ICD-10-CM | POA: Diagnosis not present

## 2018-04-17 DIAGNOSIS — E11319 Type 2 diabetes mellitus with unspecified diabetic retinopathy without macular edema: Secondary | ICD-10-CM | POA: Diagnosis not present

## 2018-04-17 DIAGNOSIS — E1169 Type 2 diabetes mellitus with other specified complication: Secondary | ICD-10-CM | POA: Diagnosis not present

## 2018-04-22 ENCOUNTER — Other Ambulatory Visit: Payer: Self-pay | Admitting: *Deleted

## 2018-04-22 ENCOUNTER — Ambulatory Visit (INDEPENDENT_AMBULATORY_CARE_PROVIDER_SITE_OTHER): Payer: Medicare Other | Admitting: Neurology

## 2018-04-22 DIAGNOSIS — E1140 Type 2 diabetes mellitus with diabetic neuropathy, unspecified: Secondary | ICD-10-CM | POA: Diagnosis not present

## 2018-04-22 DIAGNOSIS — G5602 Carpal tunnel syndrome, left upper limb: Secondary | ICD-10-CM

## 2018-04-22 DIAGNOSIS — G629 Polyneuropathy, unspecified: Secondary | ICD-10-CM

## 2018-04-22 MED ORDER — GABAPENTIN 100 MG PO CAPS: ORAL_CAPSULE | ORAL | 5 refills | Status: AC

## 2018-04-22 NOTE — Procedures (Signed)
[facility]  [address], [address]  [place], [place] [address] Tel: [phone]) [phone] Fax:  [phone] Test Date:  04/22/2018  Patient: Leslie Moore DOB: 02/17/1976 Physician: Nita Sickleonika Kiriana Worthington, DO  Sex: Female Height: 5\' 4"  Ref Phys: Nita Sickleonika Gianna Calef, DO  ID#: [id number] Temp: 34.0C Technician:    Patient Complaints: This is a 42 year old female with diabetes referred for evaluation of burning paresthesias of the hands and feet.  NCV & EMG Findings: Extensive electrodiagnostic testing of the left upper and lower extremity shows:  1. Left median sensory nerve shows prolonged distal peak latency (3.8 ms) and reduced amplitude (13.0 V).  Left ulnar and radial sensory responses are within normal limits. 2. Left sural and superficial peroneal sensory responses are within normal limits. 3. Left median motor response shows mildly prolonged distal onset latency (4.1 ms).  Left ulnar motor responses within normal limits.   4. Left peroneal motor response is absent at the extensor digitorum brevis, and normal at the tibialis anterior.  Left tibial motor responses within normal limits.   5. Left ulnar F wave and tibial H study is within normal limits.   6. There is no evidence of active or chronic motor axonal loss changes affecting any of the tested muscles.  Motor unit configuration and recruitment pattern is within normal limits.   Impression: Left median neuropathy at or distal to the wrist, consistent with a clinical diagnosis of carpal tunnel syndrome.  Overall, these findings are moderate in degree electrically.  There is no evidence of a large fiber sensorimotor polyneuropathy affecting the left side.  A small fiber neuropathy cannot be excluded by this study.   ___________________________ Nita Sickleonika Lindzy Rupert, DO    Nerve Conduction Studies Anti Sensory Summary Table   Site NR Peak (ms) Norm Peak (ms) P-T Amp (V) Norm P-T Amp  Left Median Anti Sensory (2nd Digit)  34C  Wrist     3.8 <3.4 13.0 >20  Left Radial Anti Sensory (Base 1st Digit)  34C  Wrist    2.3 <2.7 21.5 >18  Left Sup Peroneal Anti Sensory (Ant Lat Mall)  34C  12 cm    3.0 <4.5 8.3 >5  Left Sural Anti Sensory (Lat Mall)  34C  Calf    3.4 <4.5 7.5 >5  Left Ulnar Anti Sensory (5th Digit)  34C  Wrist    3.2 <3.1 13.3 >12   Motor Summary Table   Site NR Onset (ms) Norm Onset (ms) O-P Amp (mV) Norm O-P Amp Site1 Site2 Delta-0 (ms) Dist (cm) Vel (m/s) Norm Vel (m/s)  Left Median Motor (Abd Poll Brev)  34C  Wrist    4.1 <3.9 9.2 >6 Elbow Wrist 5.6 29.0 52 >50  Elbow    9.7  8.1         Left Peroneal Motor (Ext Dig Brev)  34C  Ankle NR  <5.5  >3 B Fib Ankle  0.0  >40  B Fib NR     Poplt B Fib  0.0  >40  Poplt NR            Left Peroneal TA Motor (Tib Ant)  34C  Fib Head    2.7 <4.0 5.0 >4 Poplit Fib Head 1.4 7.0 50 >40  Poplit    4.1  4.6         Left Tibial Motor (Abd Hall Brev)  34C  Ankle    5.5 <6.0 8.4 >8 Knee Ankle 10.1 35.0 40 >40  Knee  15.6  4.9         Left Ulnar Motor (Abd Dig Minimi)  34C  Wrist    3.1 <3.1 7.7 >7 B Elbow Wrist 4.5 23.0 51 >50  B Elbow    7.6  7.1  A Elbow B Elbow 2.0 10.0 50 >50  A Elbow    9.6  6.5          F Wave Studies   NR F-Lat (ms) Lat Norm (ms) L-R F-Lat (ms)  Right Ulnar (Mrkrs) (Abd Dig Min)  34C     32.26 <33    H Reflex Studies NR H-Lat (ms) Lat Norm (ms) L-R H-Lat (ms)  Left Tibial (Gastroc)  34C     34.56 <35    EMG   Side Muscle Ins Act Fibs Psw Fasc Number Recrt Dur Dur. Amp Amp. Poly Poly. Comment  Left 1stDorInt Nml Nml Nml Nml Nml Nml Nml Nml Nml Nml Nml Nml N/A  Left Abd Poll Brev Nml Nml Nml Nml Nml Nml Nml Nml Nml Nml Nml Nml N/A  Left PronatorTeres Nml Nml Nml Nml Nml Nml Nml Nml Nml Nml Nml Nml N/A  Left Biceps Nml Nml Nml Nml Nml Nml Nml Nml Nml Nml Nml Nml N/A  Left Triceps Nml Nml Nml Nml Nml Nml Nml Nml Nml Nml Nml Nml N/A  Left Deltoid Nml Nml Nml Nml Nml Nml Nml Nml Nml Nml Nml Nml N/A  Left AntTibialis Nml Nml  Nml Nml Nml Nml Nml Nml Nml Nml Nml Nml N/A  Left Gastroc Nml Nml Nml Nml Nml Nml Nml Nml Nml Nml Nml Nml N/A  Left Flex Dig Long Nml Nml Nml Nml Nml Nml Nml Nml Nml Nml Nml Nml N/A  Left RectFemoris Nml Nml Nml Nml Nml Nml Nml Nml Nml Nml Nml Nml N/A  Left GluteusMed Nml Nml Nml Nml Nml Nml Nml Nml Nml Nml Nml Nml N/A      Waveforms:

## 2018-04-22 NOTE — Progress Notes (Signed)
Follow-up Visit   Date: 01/15/2018    Leslie Moore MRN: [id number] DOB: 01-05-1976   Interim History: Leslie Moore is a 42 y.o. right-handed African American female with diabetes mellitus complicated by retinopathy returning to the clinic for follow-up of diabetic neuropathy.  The patient was accompanied to the clinic by self.  History of present illness: She was diagnosed with diabetes 2010 and starting in 2018, she started having burning, shooting, and tingling sensation of the hands and feet. Pain does not radiate in her lower legs or forearms.  She has sensitivity to touch and tight shoes.  Walking, especially barefoot makes her pain significantly worse.  Symptoms were intermittent and now occur nightly.  She has difficulty with opening jars/bottles and has had a few falls because of severe pain.  She was offered gabapentin 17m and did not appreciate benefit so stopped it.  She is not working currently.    UPDATE 01/15/2018:   She recently got medicaid and before this was unable to afford her medications, so she was not taking any of her prescription medications for several months.  Today, she continues to have severe burning and sharp pain over the feet.  Tactile stimulus makes the pain much worse.  She also has numbness over the soles of the feet.  She has severe pain when showering or lotioning her feet.  She denies imbalance.  She does have weakness of the hands and has noticed a new left hand tremor.    UPDATE 04/22/2018:  She is here for electrodiagnostic testing which did not show evidence of large fiber neuropathy.  She does have left carpal tunnel syndrom (moderate).  Pain continues to be severe and worse when walking.  Sometimes, she feels as if there is a mass under the sole of her feet, but when she looks, there is nothing there.  She has established care with endocrinology last week, which can hopefully get blood sugars better controlled. Her HbA1c in October 2019  was 16.8.   Medications:  Current Outpatient Medications on File Prior to Visit  Medication Sig Dispense Refill  . amoxicillin-clavulanate (AUGMENTIN) 875-125 MG tablet Take 1 tablet by mouth 2 (two) times daily. One po bid x 7 days 14 tablet 0  . azithromycin (ZITHROMAX) 250 MG tablet Take 1 tablet (250 mg total) by mouth daily. Take first 2 tablets together, then 1 every day until finished. 6 tablet 0  . EQ ASPIRIN ADULT LOW DOSE 81 MG EC tablet   3  . gabapentin (NEURONTIN) 300 MG capsule Take 2 capsules (600 mg total) by mouth 2 (two) times daily. 120 capsule 5  . GLIPIZIDE XL 5 MG 24 hr tablet   1  . guaifenesin (ROBITUSSIN) 100 MG/5ML syrup Take 10 mLs (200 mg total) by mouth every 4 (four) hours as needed for cough. 60 mL 0  . metFORMIN (GLUCOPHAGE) 500 MG tablet Take 500 mg by mouth 2 (two) times daily with a meal.    . Semaglutide (OZEMPIC) 0.25 or 0.5 MG/DOSE SOPN Inject into the skin.     No current facility-administered medications on file prior to visit.     Allergies: No Known Allergies  Review of Systems:  CONSTITUTIONAL: No fevers, chills, night sweats, or weight loss.  EYES: No visual changes or eye pain ENT: No hearing changes.  No history of nose bleeds.   RESPIRATORY: No cough, wheezing and shortness of breath.   CARDIOVASCULAR: Negative for chest pain, and palpitations.   GI:  Negative for abdominal discomfort, blood in stools or black stools.  No recent change in bowel habits.   GU:  No history of incontinence.   MUSCLOSKELETAL: No history of joint pain or swelling.  No myalgias.   SKIN: Negative for lesions, rash, and itching.   ENDOCRINE: Negative for cold or heat intolerance, polydipsia or goiter.   PSYCH:  No depression or anxiety symptoms.   NEURO: As Above.   Vital Signs:  There were no vitals taken for this visit.  Neurological Exam:   MENTAL STATUS including orientation to time, place, person, recent and remote memory, attention span and  concentration, language, and fund of knowledge is normal.  Speech is not dysarthric.  CRANIAL NERVES:  Pupils equal round and reactive to light.  Normal conjugate, extra-ocular eye movements in all directions of gaze.  No ptosis. Face is symmetric.   MOTOR:  Motor strength is 5/5 in all extremities, except there is mild weakness with finger abductors and toe flexors 4/5 bilaterally.  Tenderness of light palpation over the feet, severe allodynia.   No atrophy, fasciculations or abnormal movements.    SENSORY:  Vibration is intact at the knees and causes severe hyperesthesia distal to ankles.   COORDINATION/GAIT:   Gait appears antalgic,wide-based and slow.  Data: Labs 06/14/2017:  CRP 0.2, ESR 105*, ESR 91*, copper 144, folate > 23, vitamin B12 555, SSA/B neg, RF neg, cryoglobulin neg, ANCA neg, ACE neg, ANA 1:40 nucleolar  Lab Results  Component Value Date   HGBA1C (H) 03/01/2009    9.5 (NOTE) The ADA recommends the following therapeutic goal for glycemic control related to Hgb A1c measurement: Goal of therapy: <6.5 Hgb A1c  Reference: American Diabetes Association: Clinical Practice Recommendations 2010, Diabetes Care, 2010, 33: (Suppl  1).     IMPRESSION/PLAN 1.  Distal and symmetric painful diabetic neuropathy affecting stoking-glove distribution, poorly controlled diabetes (HbA1c 16.8).  No evidence of large fiber neuropathy on NCS/EMG.  I suspect she most likely has small fiber neuropathy causing severe dysesthesias, and will refer to podiatry for skin biopsy to evaluate for this.  For pain, continue gabapentin 366m in the morning and increase to 4062mat bedtime.  Cautious with further titration due to sedation.  She will be working closely with endocrinology to get blood sugars under better control.   2.  Left carpal tunnel syndrome, moderate.  Start using a wrist splint and minimize over flexion of the wrist.   Return to clinic after testing   Thank you for allowing me to  participate in patient's care.  If I can answer any additional questions, I would be pleased to do so.    Sincerely,    Drezden Seitzinger K. PaPosey ProntoDO

## 2018-05-20 DIAGNOSIS — E113513 Type 2 diabetes mellitus with proliferative diabetic retinopathy with macular edema, bilateral: Secondary | ICD-10-CM | POA: Diagnosis not present

## 2018-05-20 DIAGNOSIS — H3582 Retinal ischemia: Secondary | ICD-10-CM | POA: Diagnosis not present

## 2018-05-20 DIAGNOSIS — H2513 Age-related nuclear cataract, bilateral: Secondary | ICD-10-CM | POA: Diagnosis not present

## 2018-05-29 ENCOUNTER — Ambulatory Visit: Payer: Medicare Other

## 2018-06-02 ENCOUNTER — Ambulatory Visit: Payer: Medicare Other | Admitting: Podiatry

## 2018-06-10 ENCOUNTER — Ambulatory Visit
Admission: RE | Admit: 2018-06-10 | Discharge: 2018-06-10 | Disposition: A | Discharge status: Home / Self Care | Payer: Medicare Other | Source: Ambulatory Visit | Attending: Internal Medicine | Admitting: Internal Medicine

## 2018-06-10 DIAGNOSIS — Z1231 Encounter for screening mammogram for malignant neoplasm of breast: Secondary | ICD-10-CM | POA: Diagnosis not present

## 2018-06-25 ENCOUNTER — Encounter: Payer: Self-pay | Admitting: Podiatry

## 2018-06-25 ENCOUNTER — Ambulatory Visit (INDEPENDENT_AMBULATORY_CARE_PROVIDER_SITE_OTHER): Payer: Medicare Other

## 2018-06-25 ENCOUNTER — Other Ambulatory Visit: Payer: Self-pay | Admitting: Podiatry

## 2018-06-25 ENCOUNTER — Ambulatory Visit (INDEPENDENT_AMBULATORY_CARE_PROVIDER_SITE_OTHER): Payer: Medicare Other | Admitting: Podiatry

## 2018-06-25 VITALS — BP 111/77 | HR 109

## 2018-06-25 DIAGNOSIS — G629 Polyneuropathy, unspecified: Secondary | ICD-10-CM

## 2018-06-25 DIAGNOSIS — M79672 Pain in left foot: Secondary | ICD-10-CM

## 2018-06-25 DIAGNOSIS — E0843 Diabetes mellitus due to underlying condition with diabetic autonomic (poly)neuropathy: Secondary | ICD-10-CM

## 2018-06-25 DIAGNOSIS — M79671 Pain in right foot: Secondary | ICD-10-CM

## 2018-06-29 NOTE — Progress Notes (Signed)
   HPI: 43 year old female presenting today as a new patient with a chief complaint of sharp pain throughout the feet bilaterally that began 1-2 years ago. She reports associated intermittent tenderness to the touch and states the feet are always cold. She states her PCP diagnosed her with neuropathy. She has not done anything for treatment. There are no modifying factors noted. Patient is here for further evaluation and treatment.   Past Medical History:  Diagnosis Date  . Diabetes (HCC)   . Neuropathy      Physical Exam: General: The patient is alert and oriented x3 in no acute distress.  Dermatology: Skin is warm, dry and supple bilateral lower extremities. Negative for open lesions or macerations.  Vascular: Palpable pedal pulses bilaterally. No edema or erythema noted. Capillary refill within normal limits.  Neurological: Epicritic and protective threshold diminished bilaterally.   Musculoskeletal Exam: Range of motion within normal limits to all pedal and ankle joints bilateral. Muscle strength 5/5 in all groups bilateral.   Radiographic Exam:  Normal osseous mineralization. Joint spaces preserved. No fracture/dislocation/boney destruction.    Assessment: 1. T2DM - uncontrolled 2. Peripheral polyneuropathy BLE   Plan of Care:  1. Patient evaluated. X-Rays reviewed.  2. Stressed importance of controlling blood sugar levels. Explained that neuropathy can be irreversible but should improve with better control of her diabetes.  3. Return to clinic as needed.       Felecia Shelling, DPM [facility]  Dr. Felecia Shelling, DPM    [address] [place], [place] [address]                Office [phone]  Fax [phone]

## 2018-08-04 DIAGNOSIS — E785 Hyperlipidemia, unspecified: Secondary | ICD-10-CM | POA: Diagnosis not present

## 2018-08-04 DIAGNOSIS — E1165 Type 2 diabetes mellitus with hyperglycemia: Secondary | ICD-10-CM | POA: Diagnosis not present

## 2018-08-25 DIAGNOSIS — E785 Hyperlipidemia, unspecified: Secondary | ICD-10-CM | POA: Diagnosis not present

## 2018-08-25 DIAGNOSIS — B351 Tinea unguium: Secondary | ICD-10-CM | POA: Diagnosis not present

## 2018-08-25 DIAGNOSIS — B353 Tinea pedis: Secondary | ICD-10-CM | POA: Diagnosis not present

## 2018-08-25 DIAGNOSIS — M79672 Pain in left foot: Secondary | ICD-10-CM | POA: Diagnosis not present

## 2018-08-25 DIAGNOSIS — M79671 Pain in right foot: Secondary | ICD-10-CM | POA: Diagnosis not present

## 2018-08-25 DIAGNOSIS — E1165 Type 2 diabetes mellitus with hyperglycemia: Secondary | ICD-10-CM | POA: Diagnosis not present

## 2018-08-25 DIAGNOSIS — E1140 Type 2 diabetes mellitus with diabetic neuropathy, unspecified: Secondary | ICD-10-CM | POA: Diagnosis not present

## 2018-10-08 DIAGNOSIS — E785 Hyperlipidemia, unspecified: Secondary | ICD-10-CM | POA: Diagnosis not present

## 2018-10-08 DIAGNOSIS — E1165 Type 2 diabetes mellitus with hyperglycemia: Secondary | ICD-10-CM | POA: Diagnosis not present

## 2018-10-08 DIAGNOSIS — E1159 Type 2 diabetes mellitus with other circulatory complications: Secondary | ICD-10-CM | POA: Diagnosis not present

## 2020-11-18 IMAGING — MG DIGITAL SCREENING BILATERAL MAMMOGRAM WITH TOMO AND CAD
8 series · 9 of 24 positions shown · non-contrast
Comparison: Previous exam(s).

CLINICAL DATA: Screening.

EXAM:
DIGITAL SCREENING BILATERAL MAMMOGRAM WITH TOMO AND CAD

[L MLO synth-2D]
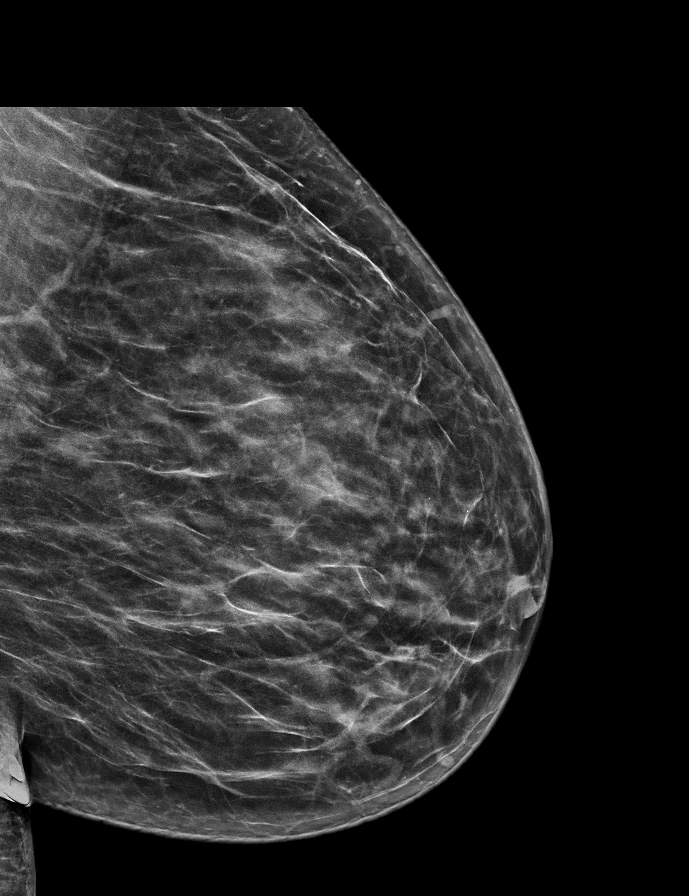

[R MLO synth-2D]
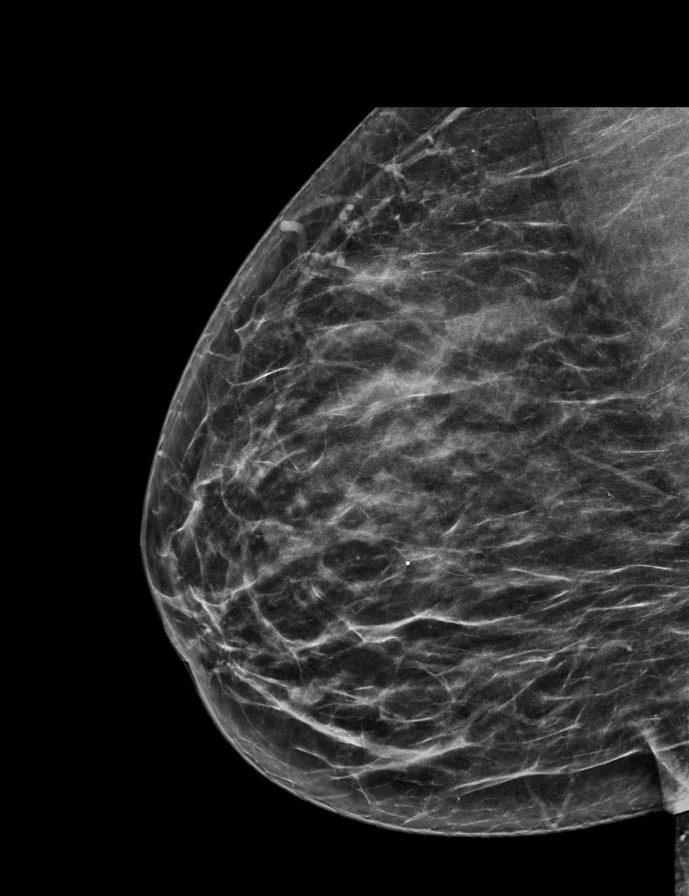

[L CC synth-2D]
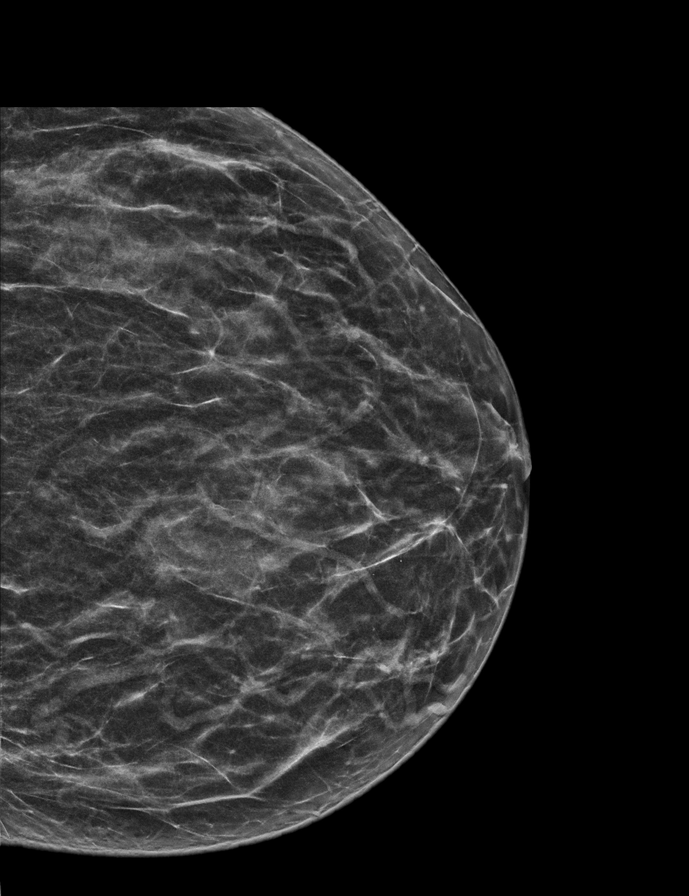

[R CC synth-2D]
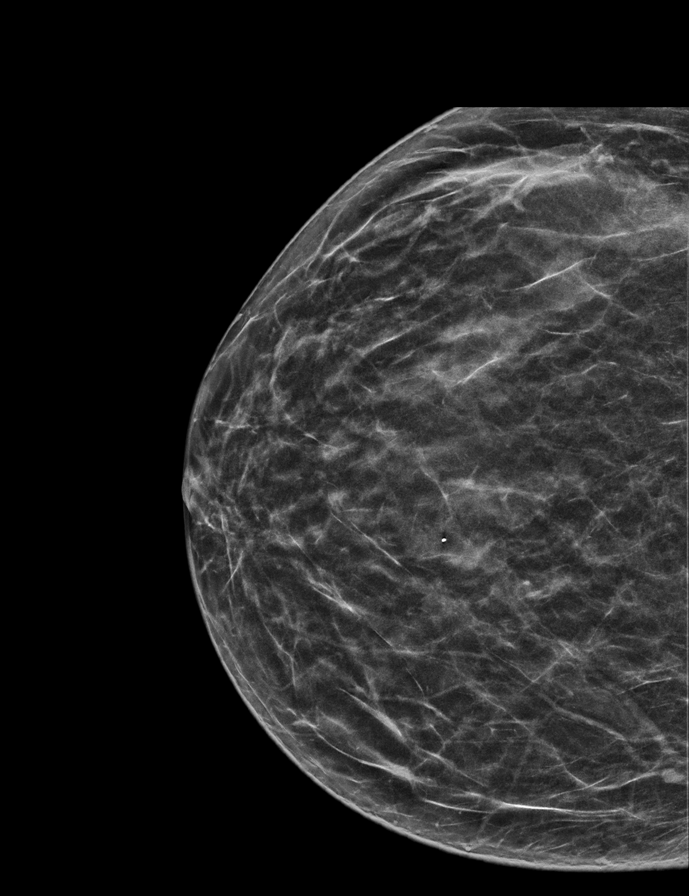

[L CC tomo · 2 of 49 frames shown]
[frame 16/49]
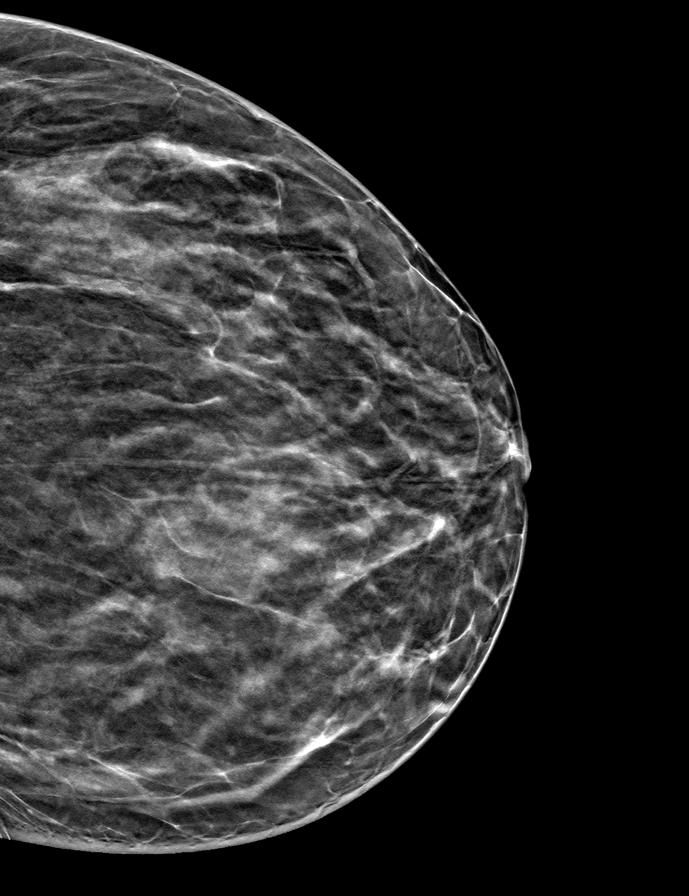
[frame 25/49]
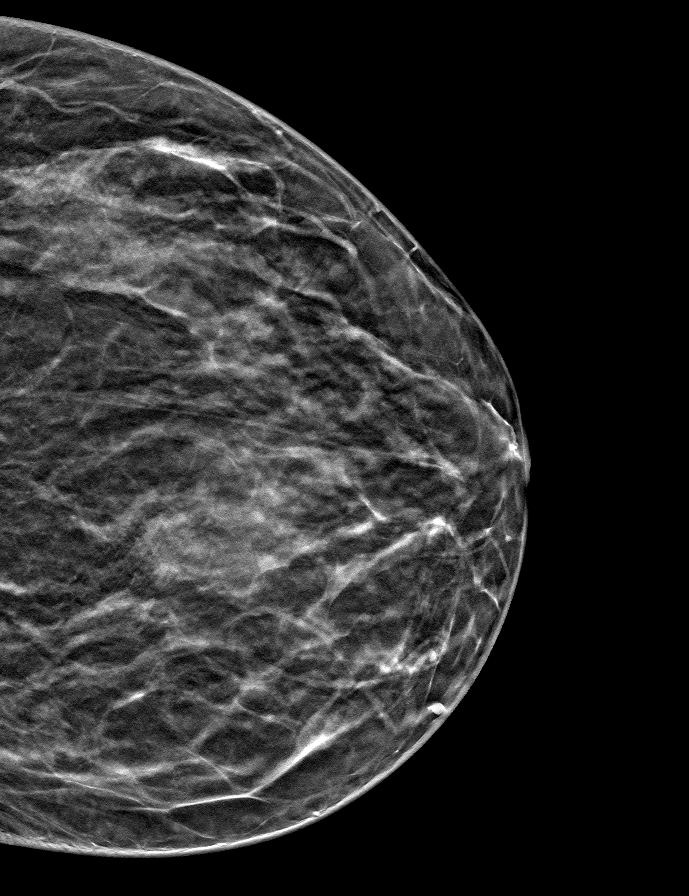

[R CC tomo · tomo slice 26/51.0]
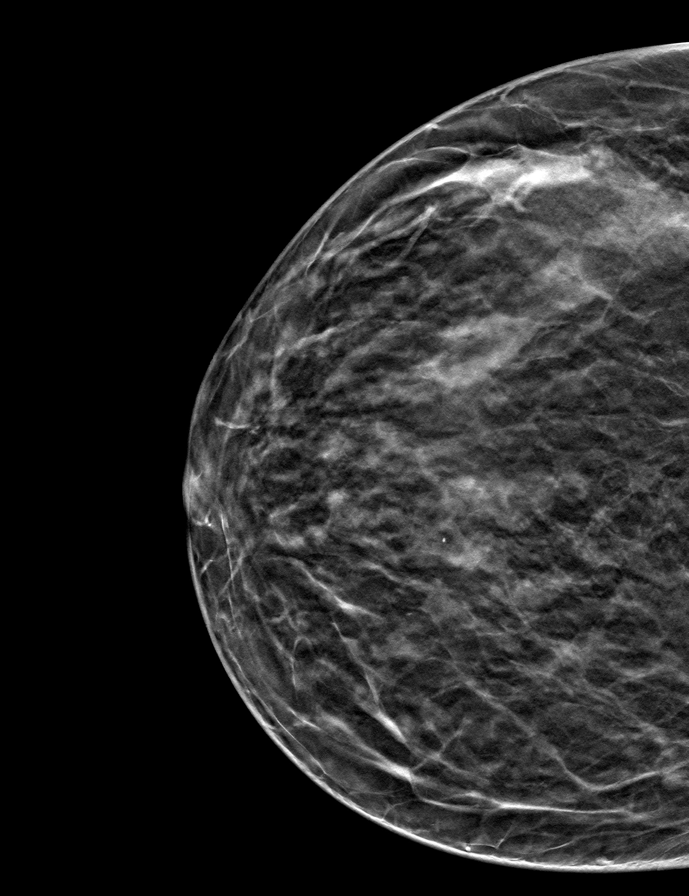

[L MLO tomo · tomo slice 30/59.0]
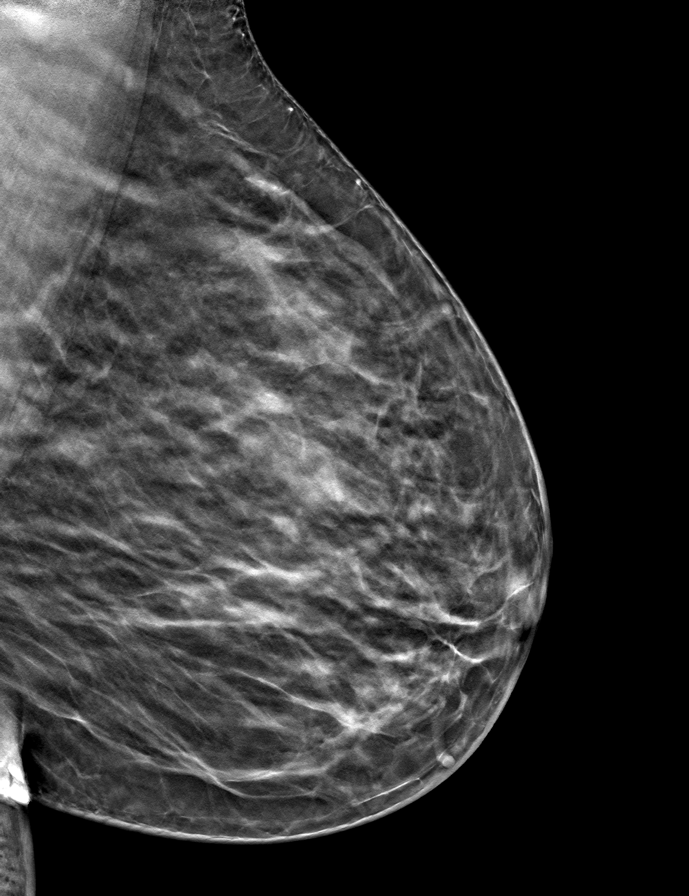

[R MLO tomo · tomo slice 30/59.0]
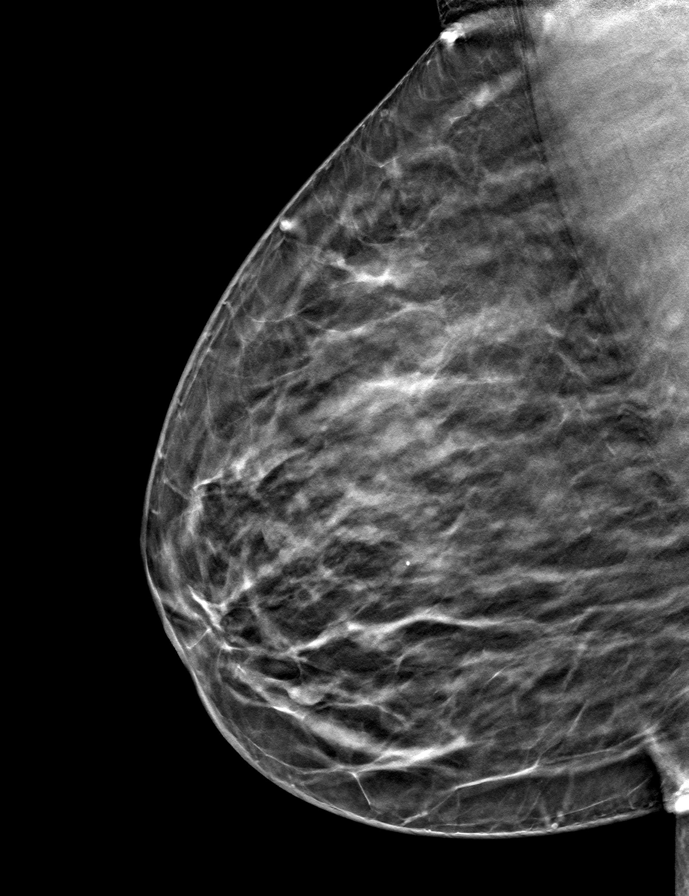

[9 of 24 positions shown; findings below may reference images not displayed]

ACR Breast Density Category c: The breast tissue is heterogeneously
dense, which may obscure small masses.
FINDINGS: There are no findings suspicious for malignancy. Images were
processed with CAD.
IMPRESSION: No mammographic evidence of malignancy. A result letter of this
screening mammogram will be mailed directly to the patient.

RECOMMENDATION:
Screening mammogram in one year. (Code:FT-U-LHB)

BI-RADS CATEGORY  1: Negative.
